# Patient Record
Sex: Female | Born: 1955 | Race: White | Hispanic: No | Smoking: Never smoker
Health system: Southern US, Community
[De-identification: ages and names within clinical notes are randomized; demographics above are authoritative.]

## PROBLEM LIST (undated history)

## (undated) DIAGNOSIS — Z86010 Personal history of colonic polyps: Secondary | ICD-10-CM

## (undated) DIAGNOSIS — Z860101 Personal history of adenomatous and serrated colon polyps: Secondary | ICD-10-CM

## (undated) DIAGNOSIS — F419 Anxiety disorder, unspecified: Secondary | ICD-10-CM

## (undated) DIAGNOSIS — T7840XA Allergy, unspecified, initial encounter: Secondary | ICD-10-CM

## (undated) HISTORY — DX: Allergy, unspecified, initial encounter: T78.40XA

## (undated) HISTORY — PX: COLONOSCOPY: SHX174

## (undated) HISTORY — DX: Anxiety disorder, unspecified: F41.9

## (undated) HISTORY — PX: POLYPECTOMY: SHX149

## (undated) HISTORY — DX: Personal history of adenomatous and serrated colon polyps: Z86.0101

## (undated) HISTORY — DX: Personal history of colonic polyps: Z86.010

---

## 1998-12-08 ENCOUNTER — Ambulatory Visit (HOSPITAL_COMMUNITY): Admission: RE | Admit: 1998-12-08 | Discharge: 1998-12-08 | Payer: Self-pay | Admitting: Obstetrics and Gynecology

## 1998-12-08 ENCOUNTER — Encounter: Payer: Self-pay | Admitting: Obstetrics and Gynecology

## 1999-08-05 ENCOUNTER — Other Ambulatory Visit: Admission: RE | Admit: 1999-08-05 | Discharge: 1999-08-05 | Payer: Self-pay | Admitting: Obstetrics and Gynecology

## 2000-11-16 ENCOUNTER — Other Ambulatory Visit: Admission: RE | Admit: 2000-11-16 | Discharge: 2000-11-16 | Payer: Self-pay | Admitting: Obstetrics and Gynecology

## 2001-11-16 ENCOUNTER — Other Ambulatory Visit: Admission: RE | Admit: 2001-11-16 | Discharge: 2001-11-16 | Payer: Self-pay | Admitting: Obstetrics and Gynecology

## 2002-11-18 ENCOUNTER — Other Ambulatory Visit: Admission: RE | Admit: 2002-11-18 | Discharge: 2002-11-18 | Payer: Self-pay | Admitting: Obstetrics and Gynecology

## 2003-12-22 ENCOUNTER — Other Ambulatory Visit: Admission: RE | Admit: 2003-12-22 | Discharge: 2003-12-22 | Payer: Self-pay | Admitting: Obstetrics and Gynecology

## 2005-01-21 ENCOUNTER — Other Ambulatory Visit: Admission: RE | Admit: 2005-01-21 | Discharge: 2005-01-21 | Payer: Self-pay | Admitting: Obstetrics and Gynecology

## 2006-01-26 ENCOUNTER — Other Ambulatory Visit: Admission: RE | Admit: 2006-01-26 | Discharge: 2006-01-26 | Payer: Self-pay | Admitting: Obstetrics & Gynecology

## 2007-04-05 ENCOUNTER — Other Ambulatory Visit: Admission: RE | Admit: 2007-04-05 | Discharge: 2007-04-05 | Payer: Self-pay | Admitting: Obstetrics & Gynecology

## 2007-06-04 ENCOUNTER — Encounter: Admission: RE | Admit: 2007-06-04 | Discharge: 2007-06-04 | Payer: Self-pay | Admitting: Obstetrics and Gynecology

## 2008-05-05 ENCOUNTER — Ambulatory Visit: Payer: Self-pay | Admitting: Gastroenterology

## 2008-05-12 ENCOUNTER — Telehealth: Payer: Self-pay | Admitting: Gastroenterology

## 2008-05-14 ENCOUNTER — Encounter (INDEPENDENT_AMBULATORY_CARE_PROVIDER_SITE_OTHER): Payer: Self-pay

## 2008-05-14 ENCOUNTER — Ambulatory Visit: Payer: Self-pay | Admitting: Gastroenterology

## 2008-05-14 ENCOUNTER — Encounter: Payer: Self-pay | Admitting: Gastroenterology

## 2008-05-19 ENCOUNTER — Other Ambulatory Visit: Admission: RE | Admit: 2008-05-19 | Discharge: 2008-05-19 | Payer: Self-pay | Admitting: Obstetrics & Gynecology

## 2008-05-20 ENCOUNTER — Encounter: Payer: Self-pay | Admitting: Gastroenterology

## 2008-06-02 DIAGNOSIS — R197 Diarrhea, unspecified: Secondary | ICD-10-CM | POA: Insufficient documentation

## 2008-06-02 DIAGNOSIS — K589 Irritable bowel syndrome without diarrhea: Secondary | ICD-10-CM | POA: Insufficient documentation

## 2008-06-02 DIAGNOSIS — G43909 Migraine, unspecified, not intractable, without status migrainosus: Secondary | ICD-10-CM | POA: Insufficient documentation

## 2008-06-02 DIAGNOSIS — J309 Allergic rhinitis, unspecified: Secondary | ICD-10-CM | POA: Insufficient documentation

## 2008-06-03 ENCOUNTER — Ambulatory Visit: Payer: Self-pay | Admitting: Gastroenterology

## 2008-06-03 LAB — CONVERTED CEMR LAB
ALT: 21 units/L (ref 0–35)
AST: 23 units/L (ref 0–37)
Albumin: 4 g/dL (ref 3.5–5.2)
Alkaline Phosphatase: 65 units/L (ref 39–117)
BUN: 20 mg/dL (ref 6–23)
Basophils Absolute: 0 10*3/uL (ref 0.0–0.1)
Basophils Relative: 0.6 % (ref 0.0–1.0)
Bilirubin, Direct: 0.1 mg/dL (ref 0.0–0.3)
CO2: 30 meq/L (ref 19–32)
Calcium: 9.2 mg/dL (ref 8.4–10.5)
Chloride: 104 meq/L (ref 96–112)
Creatinine, Ser: 0.9 mg/dL (ref 0.4–1.2)
Eosinophils Absolute: 0.2 10*3/uL (ref 0.0–0.7)
Eosinophils Relative: 2.6 % (ref 0.0–5.0)
GFR calc Af Amer: 85 mL/min
GFR calc non Af Amer: 70 mL/min
Glucose, Bld: 105 mg/dL — ABNORMAL HIGH (ref 70–99)
HCT: 37.7 % (ref 36.0–46.0)
Hemoglobin: 13.4 g/dL (ref 12.0–15.0)
Lymphocytes Relative: 29.1 % (ref 12.0–46.0)
MCHC: 35.5 g/dL (ref 30.0–36.0)
MCV: 87.5 fL (ref 78.0–100.0)
Monocytes Absolute: 0.6 10*3/uL (ref 0.1–1.0)
Monocytes Relative: 7.8 % (ref 3.0–12.0)
Neutro Abs: 4.2 10*3/uL (ref 1.4–7.7)
Neutrophils Relative %: 59.9 % (ref 43.0–77.0)
Platelets: 208 10*3/uL (ref 150–400)
Potassium: 3.9 meq/L (ref 3.5–5.1)
RBC: 4.31 M/uL (ref 3.87–5.11)
RDW: 11.8 % (ref 11.5–14.6)
Sodium: 141 meq/L (ref 135–145)
TSH: 0.64 microintl units/mL (ref 0.35–5.50)
Tissue Transglutaminase Ab, IgA: 0.3 units (ref <7)
Total Bilirubin: 0.7 mg/dL (ref 0.3–1.2)
Total Protein: 6.8 g/dL (ref 6.0–8.3)
WBC: 7.1 10*3/uL (ref 4.5–10.5)

## 2008-06-11 ENCOUNTER — Encounter: Admission: RE | Admit: 2008-06-11 | Discharge: 2008-06-11 | Payer: Self-pay | Admitting: Obstetrics and Gynecology

## 2010-12-26 ENCOUNTER — Encounter: Payer: Self-pay | Admitting: Obstetrics and Gynecology

## 2011-01-04 NOTE — Miscellaneous (Signed)
Summary: RX - Meds  Clinical Lists Changes  Medications: Added new medication of LIBRAX 2.5-5 MG  CAPS (CLIDINIUM-CHLORDIAZEPOXIDE) Take 1 tablet three times a day before meals - Signed Rx of LIBRAX 2.5-5 MG  CAPS (CLIDINIUM-CHLORDIAZEPOXIDE) Take 1 tablet three times a day before meals;  #100 x 0;  Signed;  Entered by: Burman Foster RN;  Authorized by: Burman Foster RN;  Method used: Electronic    Prescriptions: LIBRAX 2.5-5 MG  CAPS (CLIDINIUM-CHLORDIAZEPOXIDE) Take 1 tablet three times a day before meals  #100 x 0   Entered and Authorized by:   Burman Foster RN   Signed by:   Burman Foster RN on 05/14/2008   Method used:   Electronically sent to ...       [facility] [address] [phone]*       [address]       [place], [place]  [address]       Ph: [phone] or [phone]       Fax: [phone]   RxID:   [id number]

## 2014-10-10 ENCOUNTER — Telehealth: Payer: Self-pay | Admitting: Gastroenterology

## 2014-10-10 NOTE — Telephone Encounter (Signed)
Please, schedule patient for direct colonoscopy with Dr Ardis Hughs.

## 2014-10-10 NOTE — Telephone Encounter (Signed)
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 Owens Loffler. She is interested in setting up a colonoscopy. She is not having any problems. She reports her mother had colon cancer. Last colon-2009. Please, advise.

## 2014-10-10 NOTE — Telephone Encounter (Signed)
I reviewed previous colonoscopy 2009, her mother had colon cancer (in late 67s I believe).    OK for direct colonoscopy now.

## 2014-10-14 NOTE — Telephone Encounter (Signed)
Left a message for the patient to call and schedule a direct colon with Dr. Ardis Hughs.

## 2014-10-15 ENCOUNTER — Encounter: Payer: Self-pay | Admitting: Gastroenterology

## 2014-11-19 ENCOUNTER — Ambulatory Visit (AMBULATORY_SURGERY_CENTER): Payer: Self-pay | Admitting: *Deleted

## 2014-11-19 VITALS — Ht 67.0 in | Wt 131.6 lb

## 2014-11-19 DIAGNOSIS — Z800 Family history of malignant neoplasm of digestive organs: Secondary | ICD-10-CM

## 2014-11-19 MED ORDER — MOVIPREP 100 G PO SOLR: 1.0000 | Freq: Once | ORAL | Status: DC

## 2014-11-19 NOTE — Progress Notes (Signed)
No issues with past sedation. ewm No egg or soy allergy. ewm No home 02 use. No blood thinners. No diet pills. ewm Pt declined emmi. ewm

## 2014-11-24 ENCOUNTER — Telehealth: Payer: Self-pay | Admitting: Gastroenterology

## 2014-11-24 NOTE — Telephone Encounter (Signed)
Returned phone call to patient. Pt forgot coupon when she went to pharmacy, rx approximately 80.00 without coupon. Wondering if there was anything we could do further. Informed her that I could place a voucher for a free prep at the desk. She stated that she did not want to take that voucher from someone who would really need it. She would just pay for the prep herself and take the coupon with her the next time she went to the pharmacy.

## 2014-12-19 ENCOUNTER — Encounter: Payer: Self-pay | Admitting: Gastroenterology

## 2014-12-19 ENCOUNTER — Ambulatory Visit (AMBULATORY_SURGERY_CENTER): Payer: BLUE CROSS/BLUE SHIELD | Admitting: Gastroenterology

## 2014-12-19 VITALS — BP 128/73 | HR 62 | Temp 98.6°F | Resp 14 | Ht 67.0 in | Wt 131.0 lb

## 2014-12-19 DIAGNOSIS — Z800 Family history of malignant neoplasm of digestive organs: Secondary | ICD-10-CM

## 2014-12-19 DIAGNOSIS — Z1211 Encounter for screening for malignant neoplasm of colon: Secondary | ICD-10-CM

## 2014-12-19 DIAGNOSIS — D122 Benign neoplasm of ascending colon: Secondary | ICD-10-CM

## 2014-12-19 MED ORDER — SODIUM CHLORIDE 0.9 % IV SOLN: 500.0000 mL | INTRAVENOUS | Status: DC

## 2014-12-19 NOTE — Op Note (Signed)
[facility]  [address] [place], [address]   COLONOSCOPY PROCEDURE REPORT  PATIENT: Kelly, Vang  MR#: [id number] BIRTHDATE: [birth year] , 58  yrs. old GENDER: female ENDOSCOPIST: Milus Banister, MD PROCEDURE DATE:  12/19/2014 PROCEDURE:   Colonoscopy with snare polypectomy First Screening Colonoscopy - Avg.  risk and is 50 yrs.  old or older - No.  Prior Negative Screening - Now for repeat screening. Above average risk  History of Adenoma - Now for follow-up colonoscopy & has been > or = to 3 yrs.  N/A  Polyps Removed Today? Yes. ASA CLASS:   Class II INDICATIONS:patient's immediate family history of colon cancer (mother). MEDICATIONS: Monitored anesthesia care and Propofol 200 mg IV  DESCRIPTION OF PROCEDURE:   After the risks benefits and alternatives of the procedure were thoroughly explained, informed consent was obtained.  The digital rectal exam revealed no abnormalities of the rectum.   The LB [id number]  endoscope was introduced through the anus and advanced to the cecum, which was identified by both the appendix and ileocecal valve. No adverse events experienced.   The quality of the prep was excellent.  The instrument was then slowly withdrawn as the colon was fully examined.  COLON FINDINGS: One polyp was found, removed and sent to pathology. This was sessile, located in ascending segment, 106mm across, removed with cold snare.  The examination was otherwise normal. Retroflexed views revealed no abnormalities. The time to cecum=2 minutes 41 seconds.  Withdrawal time=8 minutes 36 seconds.  The scope was withdrawn and the procedure completed. COMPLICATIONS: There were no immediate complications.  ENDOSCOPIC IMPRESSION: One polyp was found, removed and sent to pathology. The examination was otherwise normal  RECOMMENDATIONS: If the polyp(s) removed today are proven to be adenomatous (pre-cancerous) polyps, you will need a repeat  colonoscopy in 5 years.   You will receive a letter within 1-2 weeks with the results of your biopsy as well as final recommendations.  Please call my office if you have not received a letter after 3 weeks.  eSigned:  Milus Banister, MD 12/19/2014 8:42 AM

## 2014-12-19 NOTE — Patient Instructions (Signed)
YOU HAD AN ENDOSCOPIC PROCEDURE TODAY AT THE [facility]: Refer to the procedure report that was given to you for any specific questions about what was found during the examination.  If the procedure report does not answer your questions, please call your gastroenterologist to clarify.  If you requested that your care partner not be given the details of your procedure findings, then the procedure report has been included in a sealed envelope for you to review at your convenience later.  YOU SHOULD EXPECT: Some feelings of bloating in the abdomen. Passage of more gas than usual.  Walking can help get rid of the air that was put into your GI tract during the procedure and reduce the bloating. If you had a lower endoscopy (such as a colonoscopy or flexible sigmoidoscopy) you may notice spotting of blood in your stool or on the toilet paper. If you underwent a bowel prep for your procedure, then you may not have a normal bowel movement for a few days.  DIET: Your first meal following the procedure should be a light meal and then it is ok to progress to your normal diet.  A half-sandwich or bowl of soup is an example of a good first meal.  Heavy or fried foods are harder to digest and may make you feel nauseous or bloated.  Likewise meals heavy in dairy and vegetables can cause extra gas to form and this can also increase the bloating.  Drink plenty of fluids but you should avoid alcoholic beverages for 24 hours.  ACTIVITY: Your care partner should take you home directly after the procedure.  You should plan to take it easy, moving slowly for the rest of the day.  You can resume normal activity the day after the procedure however you should NOT DRIVE or use heavy machinery for 24 hours (because of the sedation medicines used during the test).    SYMPTOMS TO REPORT IMMEDIATELY: A gastroenterologist can be reached at any hour.  During normal business hours, 8:30 AM to 5:00 PM Monday through Friday,  call [phone].  After hours and on weekends, please call the GI answering service at [phone] who will take a message and have the physician on call contact you.   Following lower endoscopy (colonoscopy or flexible sigmoidoscopy):  Excessive amounts of blood in the stool  Significant tenderness or worsening of abdominal pains  Swelling of the abdomen that is new, acute  Fever of 100F or higher   FOLLOW UP: If any biopsies were taken you will be contacted by phone or by letter within the next 1-3 weeks.  Call your gastroenterologist if you have not heard about the biopsies in 3 weeks.  Our staff will call the home number listed on your records the next business day following your procedure to check on you and address any questions or concerns that you may have at that time regarding the information given to you following your procedure. This is a courtesy call and so if there is no answer at the home number and we have not heard from you through the emergency physician on call, we will assume that you have returned to your regular daily activities without incident.  SIGNATURES/CONFIDENTIALITY: You and/or your care partner have signed paperwork which will be entered into your electronic medical record.  These signatures attest to the fact that that the information above on your After Visit Summary has been reviewed and is understood.  Full responsibility of the confidentiality of   this discharge information lies with you and/or your care-partner.  Polyp-handout given  Repeat colonoscopy will be determined by pathology   

## 2014-12-19 NOTE — Progress Notes (Signed)
Report to PACU, RN, vss, BBS= Clear.  

## 2014-12-19 NOTE — Progress Notes (Signed)
Called to room to assist during endoscopic procedure.  Patient ID and intended procedure confirmed with present staff. Received instructions for my participation in the procedure from the performing physician.  

## 2014-12-22 ENCOUNTER — Telehealth: Payer: Self-pay | Admitting: *Deleted

## 2014-12-22 NOTE — Telephone Encounter (Signed)
  Follow up Call-  Call back number 12/19/2014  Post procedure Call Back phone  # [phone]  Permission to leave phone message Yes     Patient questions:  Do you have a fever, pain , or abdominal swelling? No. Pain Score  0 *  Have you tolerated food without any problems? Yes.    Have you been able to return to your normal activities? Yes.    Do you have any questions about your discharge instructions: Diet   No. Medications  No. Follow up visit  No.  Do you have questions or concerns about your Care? No.  Actions: * If pain score is 4 or above: No action needed, pain <4.

## 2014-12-28 ENCOUNTER — Encounter: Payer: Self-pay | Admitting: Gastroenterology

## 2018-07-30 DIAGNOSIS — R682 Dry mouth, unspecified: Secondary | ICD-10-CM | POA: Diagnosis not present

## 2018-07-30 DIAGNOSIS — H6121 Impacted cerumen, right ear: Secondary | ICD-10-CM | POA: Diagnosis not present

## 2018-07-30 DIAGNOSIS — J343 Hypertrophy of nasal turbinates: Secondary | ICD-10-CM | POA: Diagnosis not present

## 2018-07-30 DIAGNOSIS — J310 Chronic rhinitis: Secondary | ICD-10-CM | POA: Diagnosis not present

## 2018-07-30 DIAGNOSIS — H6983 Other specified disorders of Eustachian tube, bilateral: Secondary | ICD-10-CM | POA: Diagnosis not present

## 2018-10-23 ENCOUNTER — Other Ambulatory Visit (HOSPITAL_COMMUNITY)
Admission: RE | Admit: 2018-10-23 | Discharge: 2018-10-23 | Disposition: A | Discharge status: Home / Self Care | Payer: BLUE CROSS/BLUE SHIELD | Source: Ambulatory Visit | Attending: Family Medicine | Admitting: Family Medicine

## 2018-10-23 ENCOUNTER — Other Ambulatory Visit: Payer: Self-pay | Admitting: Family Medicine

## 2018-10-23 DIAGNOSIS — Z0000 Encounter for general adult medical examination without abnormal findings: Secondary | ICD-10-CM | POA: Insufficient documentation

## 2018-10-23 DIAGNOSIS — Z1231 Encounter for screening mammogram for malignant neoplasm of breast: Secondary | ICD-10-CM

## 2018-10-23 DIAGNOSIS — Z23 Encounter for immunization: Secondary | ICD-10-CM | POA: Diagnosis not present

## 2018-10-23 DIAGNOSIS — R030 Elevated blood-pressure reading, without diagnosis of hypertension: Secondary | ICD-10-CM | POA: Diagnosis not present

## 2018-10-23 DIAGNOSIS — Z13220 Encounter for screening for lipoid disorders: Secondary | ICD-10-CM | POA: Diagnosis not present

## 2018-10-24 LAB — CYTOLOGY - PAP
Diagnosis: NEGATIVE
HPV (WINDOPATH): NOT DETECTED

## 2018-11-13 DIAGNOSIS — R030 Elevated blood-pressure reading, without diagnosis of hypertension: Secondary | ICD-10-CM | POA: Diagnosis not present

## 2018-11-13 DIAGNOSIS — Z23 Encounter for immunization: Secondary | ICD-10-CM | POA: Diagnosis not present

## 2019-10-14 ENCOUNTER — Other Ambulatory Visit: Payer: Self-pay

## 2019-10-14 DIAGNOSIS — Z20822 Contact with and (suspected) exposure to covid-19: Secondary | ICD-10-CM

## 2019-10-16 LAB — NOVEL CORONAVIRUS, NAA: SARS-CoV-2, NAA: NOT DETECTED

## 2019-10-25 DIAGNOSIS — Z13220 Encounter for screening for lipoid disorders: Secondary | ICD-10-CM | POA: Diagnosis not present

## 2019-10-25 DIAGNOSIS — Z0000 Encounter for general adult medical examination without abnormal findings: Secondary | ICD-10-CM | POA: Diagnosis not present

## 2019-10-29 ENCOUNTER — Other Ambulatory Visit: Payer: Self-pay | Admitting: Family Medicine

## 2019-10-29 DIAGNOSIS — Z1231 Encounter for screening mammogram for malignant neoplasm of breast: Secondary | ICD-10-CM

## 2019-10-30 ENCOUNTER — Encounter: Payer: Self-pay | Admitting: Gastroenterology

## 2019-11-21 DIAGNOSIS — Z13220 Encounter for screening for lipoid disorders: Secondary | ICD-10-CM | POA: Diagnosis not present

## 2019-11-21 DIAGNOSIS — Z1231 Encounter for screening mammogram for malignant neoplasm of breast: Secondary | ICD-10-CM | POA: Diagnosis not present

## 2019-11-21 DIAGNOSIS — Z0000 Encounter for general adult medical examination without abnormal findings: Secondary | ICD-10-CM | POA: Diagnosis not present

## 2019-11-21 DIAGNOSIS — Z23 Encounter for immunization: Secondary | ICD-10-CM | POA: Diagnosis not present

## 2019-12-16 ENCOUNTER — Other Ambulatory Visit: Payer: Self-pay

## 2019-12-16 ENCOUNTER — Ambulatory Visit (AMBULATORY_SURGERY_CENTER): Payer: Self-pay | Admitting: *Deleted

## 2019-12-16 VITALS — Temp 96.6°F | Ht 67.0 in | Wt 128.0 lb

## 2019-12-16 DIAGNOSIS — Z86010 Personal history of colonic polyps: Secondary | ICD-10-CM

## 2019-12-16 DIAGNOSIS — Z800 Family history of malignant neoplasm of digestive organs: Secondary | ICD-10-CM

## 2019-12-16 DIAGNOSIS — Z01818 Encounter for other preprocedural examination: Secondary | ICD-10-CM

## 2019-12-16 MED ORDER — SUPREP BOWEL PREP KIT 17.5-3.13-1.6 GM/177ML PO SOLN: 1.0000 | Freq: Once | ORAL | 0 refills | Status: AC

## 2019-12-16 NOTE — Progress Notes (Signed)
No egg or soy allergy known to patient  No issues with past sedation with any surgeries  or procedures, no intubation problems  No diet pills per patient No home 02 use per patient  No blood thinners per patient  Pt denies issues with constipation -PT HAS INCREASED GAS ISSUES  No A fib or A flutter  EMMI video sent to pt's e mail  SUPREP $15 COUPON   Due to the COVID-19 pandemic we are asking patients to follow these guidelines. Please only bring one care partner. Please be aware that your care partner may wait in the car in the parking lot or if they feel like they will be too hot to wait in the car, they may wait in the lobby on the 4th floor. All care partners are required to wear a mask the entire time (we do not have any that we can provide them), they need to practice social distancing, and we will do a Covid check for all patient's and care partners when you arrive. Also we will check their temperature and your temperature. If the care partner waits in their car they need to stay in the parking lot the entire time and we will call them on their cell phone when the patient is ready for discharge so they can bring the car to the front of the building. Also all patient's will need to wear a mask into building.

## 2019-12-18 ENCOUNTER — Encounter: Payer: Self-pay | Admitting: Gastroenterology

## 2019-12-26 ENCOUNTER — Ambulatory Visit (INDEPENDENT_AMBULATORY_CARE_PROVIDER_SITE_OTHER): Payer: BC Managed Care – PPO

## 2019-12-26 ENCOUNTER — Other Ambulatory Visit: Payer: Self-pay | Admitting: Gastroenterology

## 2019-12-26 ENCOUNTER — Ambulatory Visit
Admission: RE | Admit: 2019-12-26 | Discharge: 2019-12-26 | Disposition: A | Discharge status: Home / Self Care | Payer: BC Managed Care – PPO | Source: Ambulatory Visit | Attending: Family Medicine | Admitting: Family Medicine

## 2019-12-26 ENCOUNTER — Other Ambulatory Visit: Payer: Self-pay

## 2019-12-26 DIAGNOSIS — Z1159 Encounter for screening for other viral diseases: Secondary | ICD-10-CM

## 2019-12-26 DIAGNOSIS — Z1231 Encounter for screening mammogram for malignant neoplasm of breast: Secondary | ICD-10-CM | POA: Diagnosis not present

## 2019-12-27 LAB — SARS CORONAVIRUS 2 (TAT 6-24 HRS): SARS Coronavirus 2: NEGATIVE

## 2019-12-30 ENCOUNTER — Other Ambulatory Visit: Payer: Self-pay

## 2019-12-30 ENCOUNTER — Ambulatory Visit (AMBULATORY_SURGERY_CENTER): Payer: BC Managed Care – PPO | Admitting: Gastroenterology

## 2019-12-30 ENCOUNTER — Encounter: Payer: Self-pay | Admitting: Gastroenterology

## 2019-12-30 VITALS — BP 118/67 | HR 68 | Temp 97.1°F | Resp 12 | Ht 67.0 in | Wt 128.0 lb

## 2019-12-30 DIAGNOSIS — Z86010 Personal history of colonic polyps: Secondary | ICD-10-CM

## 2019-12-30 DIAGNOSIS — Z1211 Encounter for screening for malignant neoplasm of colon: Secondary | ICD-10-CM | POA: Diagnosis not present

## 2019-12-30 MED ORDER — SODIUM CHLORIDE 0.9 % IV SOLN: 500.0000 mL | Freq: Once | INTRAVENOUS | Status: DC

## 2019-12-30 NOTE — Patient Instructions (Signed)
HANDOUTS PROVIDED ON: DIVERTICULOSIS  You may resume your previous diet and medication schedule.  Your next colonoscopy for screening purposes should occur in 5 years.  Thank you for allowing [place] to care for you today!!!  YOU HAD AN ENDOSCOPIC PROCEDURE TODAY AT [facility]:   Refer to the procedure report that was given to you for any specific questions about what was found during the examination.  If the procedure report does not answer your questions, please call your gastroenterologist to clarify.  If you requested that your care partner not be given the details of your procedure findings, then the procedure report has been included in a sealed envelope for you to review at your convenience later.  YOU SHOULD EXPECT: Some feelings of bloating in the abdomen. Passage of more gas than usual.  Walking can help get rid of the air that was put into your GI tract during the procedure and reduce the bloating. If you had a lower endoscopy (such as a colonoscopy or flexible sigmoidoscopy) you may notice spotting of blood in your stool or on the toilet paper. If you underwent a bowel prep for your procedure, you may not have a normal bowel movement for a few days.  Please Note:  You might notice some irritation and congestion in your nose or some drainage.  This is from the oxygen used during your procedure.  There is no need for concern and it should clear up in a day or so.  SYMPTOMS TO REPORT IMMEDIATELY:   Following lower endoscopy (colonoscopy or flexible sigmoidoscopy):  Excessive amounts of blood in the stool  Significant tenderness or worsening of abdominal pains  Swelling of the abdomen that is new, acute  Fever of 100F or higher  For urgent or emergent issues, a gastroenterologist can be reached at any hour by calling [phone].   DIET:  We do recommend a small meal at first, but then you may proceed to your regular diet.  Drink plenty of fluids but you should  avoid alcoholic beverages for 24 hours.  ACTIVITY:  You should plan to take it easy for the rest of today and you should NOT DRIVE or use heavy machinery until tomorrow (because of the sedation medicines used during the test).    FOLLOW UP: Our staff will call the number listed on your records 48-72 hours following your procedure to check on you and address any questions or concerns that you may have regarding the information given to you following your procedure. If we do not reach you, we will leave a message.  We will attempt to reach you two times.  During this call, we will ask if you have developed any symptoms of COVID 19. If you develop any symptoms (ie: fever, flu-like symptoms, shortness of breath, cough etc.) before then, please call [phone].  If you test positive for Covid 19 in the 2 weeks post procedure, please call and report this information to [place].    If any biopsies were taken you will be contacted by phone or by letter within the next 1-3 weeks.  Please call us at [phone] if you have not heard about the biopsies in 3 weeks.    SIGNATURES/CONFIDENTIALITY: You and/or your care partner have signed paperwork which will be entered into your electronic medical record.  These signatures attest to the fact that that the information above on your After Visit Summary has been reviewed and is understood.  Full responsibility of the confidentiality  of this discharge information lies with you and/or your care-partner.

## 2019-12-30 NOTE — Progress Notes (Signed)
Temp by LC and vitals by DT

## 2019-12-30 NOTE — Op Note (Signed)
[facility] Patient Name: Kelly Vang Procedure Date: 12/30/2019 8:56 AM MRN: [id number] Endoscopist: Milus Banister , MD Age: 64 Referring MD:  Date of Birth: 08-31-1956 Gender: Female Account #: [id number] Procedure:                Colonoscopy Indications:              High risk colon cancer surveillance: Personal                            history of colonic polyps; Colonoscopy 2016 single                            subCM adenoma, also mother and sister with colon                            cancer Medicines:                Monitored Anesthesia Care Procedure:                Pre-Anesthesia Assessment:                           - Prior to the procedure, a History and Physical                            was performed, and patient medications and                            allergies were reviewed. The patient's tolerance of                            previous anesthesia was also reviewed. The risks                            and benefits of the procedure and the sedation                            options and risks were discussed with the patient.                            All questions were answered, and informed consent                            was obtained. Prior Anticoagulants: The patient has                            taken no previous anticoagulant or antiplatelet                            agents. ASA Grade Assessment: II - A patient with                            mild systemic disease. After reviewing the risks  and benefits, the patient was deemed in                            satisfactory condition to undergo the procedure.                           After obtaining informed consent, the colonoscope                            was passed under direct vision. Throughout the                            procedure, the patient's blood pressure, pulse, and                            oxygen saturations were monitored continuously. The                        Colonoscope was introduced through the anus and                            advanced to the the cecum, identified by                            appendiceal orifice and ileocecal valve. The                            colonoscopy was performed without difficulty. The                            patient tolerated the procedure well. The quality                            of the bowel preparation was good. The ileocecal                            valve, appendiceal orifice, and rectum were                            photographed. Scope In: 9:02:15 AM Scope Out: 9:17:01 AM Scope Withdrawal Time: 0 hours 11 minutes 51 seconds  Total Procedure Duration: 0 hours 14 minutes 46 seconds  Findings:                 A few small-mouthed diverticula were found in the                            left colon.                           The exam was otherwise without abnormality on                            direct and retroflexion views. Complications:            No immediate complications. Estimated blood loss:  None. Estimated Blood Loss:     Estimated blood loss: none. Impression:               - Diverticulosis in the left colon.                           - The examination was otherwise normal on direct                            and retroflexion views.                           - No polyps or cancers. Recommendation:           - Patient has a contact number available for                            emergencies. The signs and symptoms of potential                            delayed complications were discussed with the                            patient. Return to normal activities tomorrow.                            Written discharge instructions were provided to the                            patient.                           - Resume previous diet.                           - Continue present medications.                           - Repeat colonoscopy in  5 years for screening given                            significant FH of colon cancer (sister and mother). Milus Banister, MD 12/30/2019 9:20:44 AM This report has been signed electronically.

## 2019-12-30 NOTE — Progress Notes (Signed)
PT taken to PACU. Monitors in place. VSS. Report given to RN. 

## 2020-01-01 ENCOUNTER — Telehealth: Payer: Self-pay | Admitting: *Deleted

## 2020-01-01 NOTE — Telephone Encounter (Signed)
  Follow up Call-  Call back number 12/30/2019  Post procedure Call Back phone  # [phone]  Permission to leave phone message Yes  Some recent data might be hidden     Patient questions:  Do you have a fever, pain , or abdominal swelling? No. Pain Score  0 *  Have you tolerated food without any problems? Yes.    Have you been able to return to your normal activities? Yes.    Do you have any questions about your discharge instructions: Diet   No. Medications  No. Follow up visit  No.  Do you have questions or concerns about your Care? No.  Actions: * If pain score is 4 or above: No action needed, pain <4.  1. Have you developed a fever since your procedure? no  2.   Have you had an respiratory symptoms (SOB or cough) since your procedure? no  3.   Have you tested positive for COVID 19 since your procedure no  4.   Have you had any family members/close contacts diagnosed with the COVID 19 since your procedure?  no   If yes to any of these questions please route to Joylene John, RN and Alphonsa Gin, Therapist, sports.

## 2020-01-16 ENCOUNTER — Ambulatory Visit: Payer: BC Managed Care – PPO

## 2020-01-20 DIAGNOSIS — Z20828 Contact with and (suspected) exposure to other viral communicable diseases: Secondary | ICD-10-CM | POA: Diagnosis not present

## 2020-01-30 ENCOUNTER — Ambulatory Visit: Payer: BC Managed Care – PPO | Attending: Internal Medicine

## 2020-01-30 DIAGNOSIS — Z23 Encounter for immunization: Secondary | ICD-10-CM | POA: Insufficient documentation

## 2020-01-30 NOTE — Progress Notes (Signed)
   Covid-19 Vaccination Clinic  Name:  Kelly Vang    MRN: [id number] DOB: June 27, 1956  01/30/2020  Ms. Kelly Vang was observed post Covid-19 immunization for 15 minutes without incidence. She was provided with Vaccine Information Sheet and instruction to access the V-Safe system.   Ms. Kelly Vang was instructed to call 911 with any severe reactions post vaccine: Marland Kitchen Difficulty breathing  . Swelling of your face and throat  . A fast heartbeat  . A bad rash all over your body  . Dizziness and weakness    Immunizations Administered    Name Date Dose VIS Date Route   Pfizer COVID-19 Vaccine 01/30/2020  2:05 PM 0.3 mL 11/15/2019 Intramuscular   Manufacturer: [place]   Lot: [id number]   [place]: [id number]

## 2020-02-26 ENCOUNTER — Ambulatory Visit: Payer: BC Managed Care – PPO | Attending: Internal Medicine

## 2020-02-26 DIAGNOSIS — Z23 Encounter for immunization: Secondary | ICD-10-CM

## 2020-02-26 NOTE — Progress Notes (Signed)
   Covid-19 Vaccination Clinic  Name:  Kelly Vang    MRN: [id number] DOB: [birth year]  02/26/2020  [name] was observed post Covid-19 immunization for 15 minutes without incident. She was provided with Vaccine Information Sheet and instruction to access the V-Safe system.   [name] was instructed to call 911 with any severe reactions post vaccine: Marland Kitchen Difficulty breathing  . Swelling of face and throat  . A fast heartbeat  . A bad rash all over body  . Dizziness and weakness   Immunizations Administered    Name Date Dose VIS Date Route   Pfizer COVID-19 Vaccine 02/26/2020  1:57 PM 0.3 mL 11/15/2019 Intramuscular   Manufacturer: [place]   Lot: [id number]   [place]: [id number]

## 2020-04-30 DIAGNOSIS — Z23 Encounter for immunization: Secondary | ICD-10-CM | POA: Diagnosis not present

## 2021-01-28 ENCOUNTER — Other Ambulatory Visit: Payer: Self-pay | Admitting: Family Medicine

## 2021-01-28 DIAGNOSIS — Z1231 Encounter for screening mammogram for malignant neoplasm of breast: Secondary | ICD-10-CM

## 2021-01-28 DIAGNOSIS — Z780 Asymptomatic menopausal state: Secondary | ICD-10-CM

## 2021-05-26 IMAGING — MG DIGITAL SCREENING BILAT W/ TOMO W/ CAD
6 of 10 series · 6 of 30 positions shown · non-contrast
Comparison: Previous exam(s).

CLINICAL DATA: Screening.

EXAM:
DIGITAL SCREENING BILATERAL MAMMOGRAM WITH TOMO AND CAD

[R MLO synth-2D (1 of 2)]
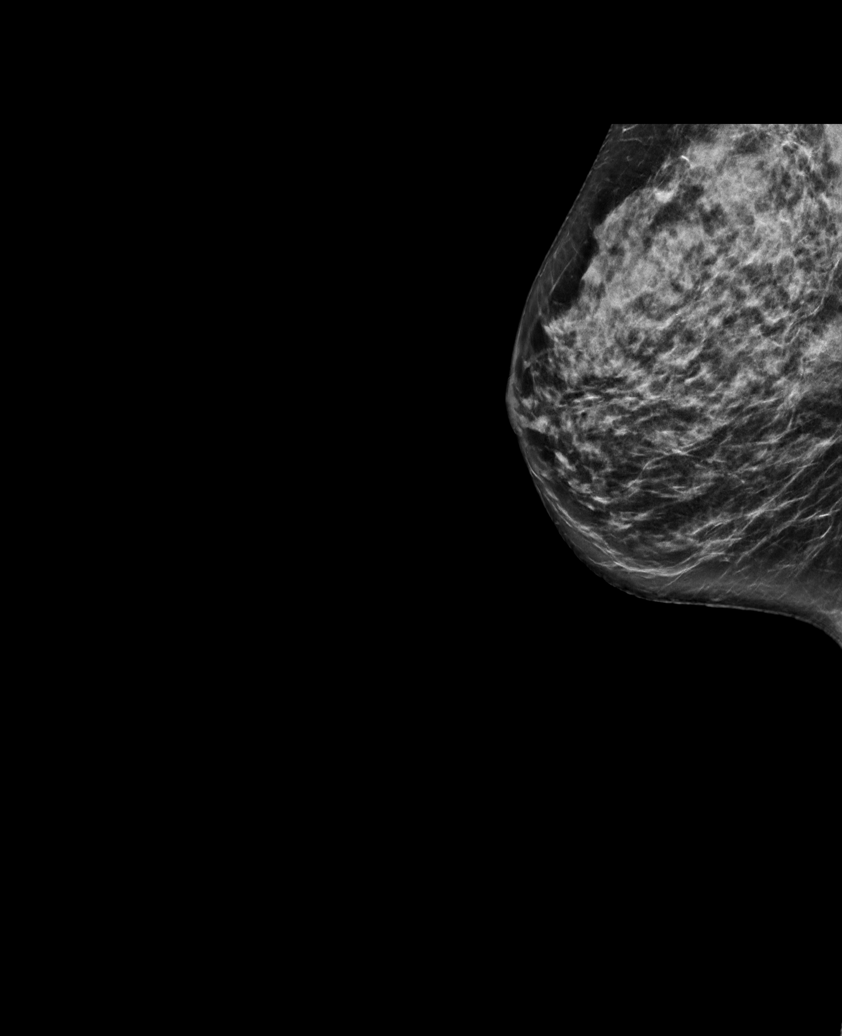

[L CC synth-2D]
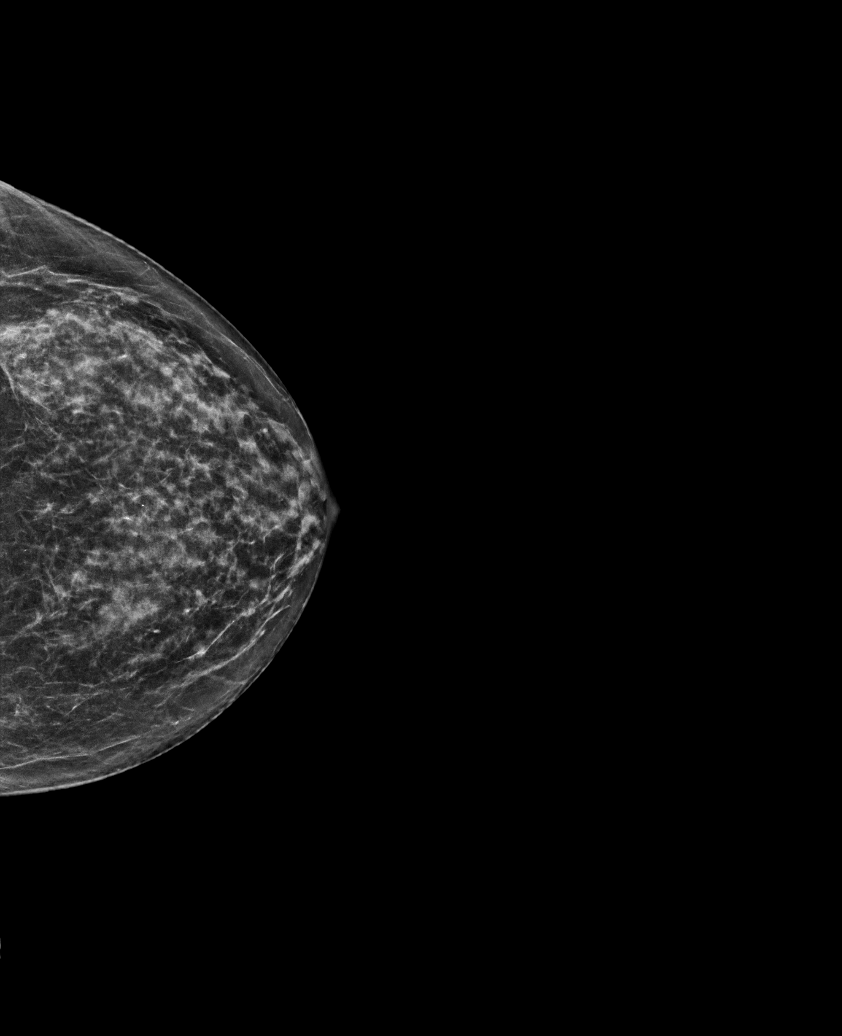

[L MLO synth-2D]
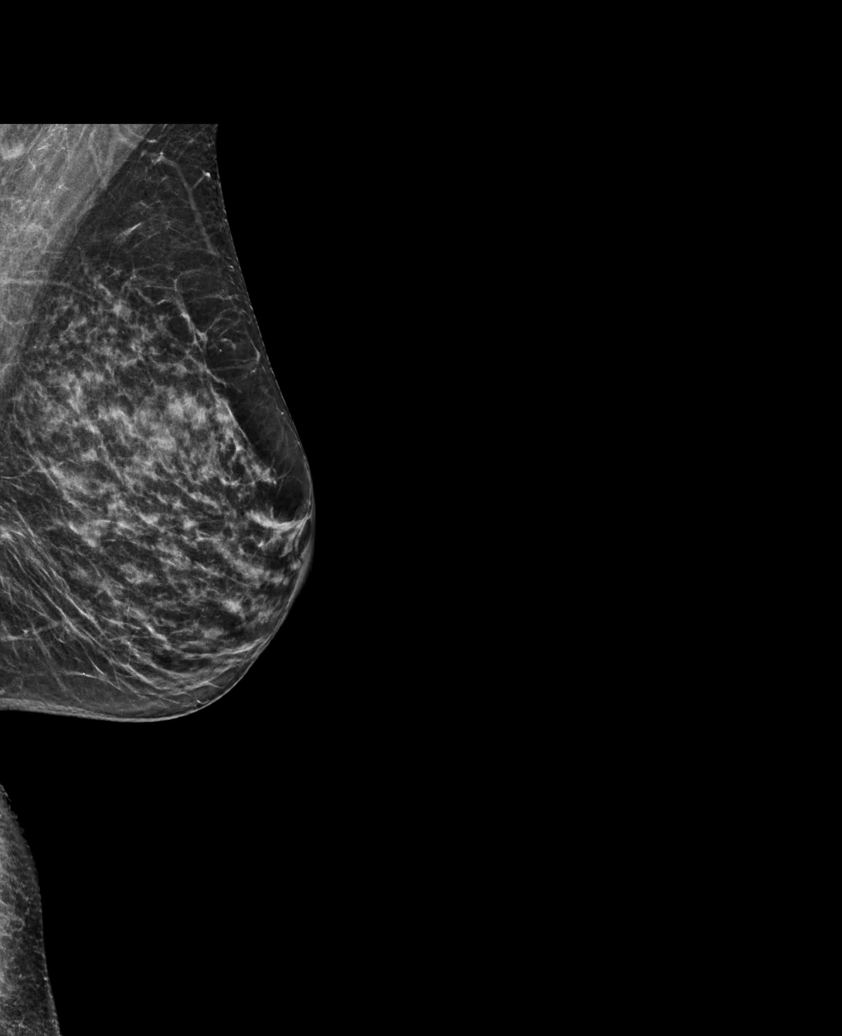

[R MLO synth-2D (2 of 2)]
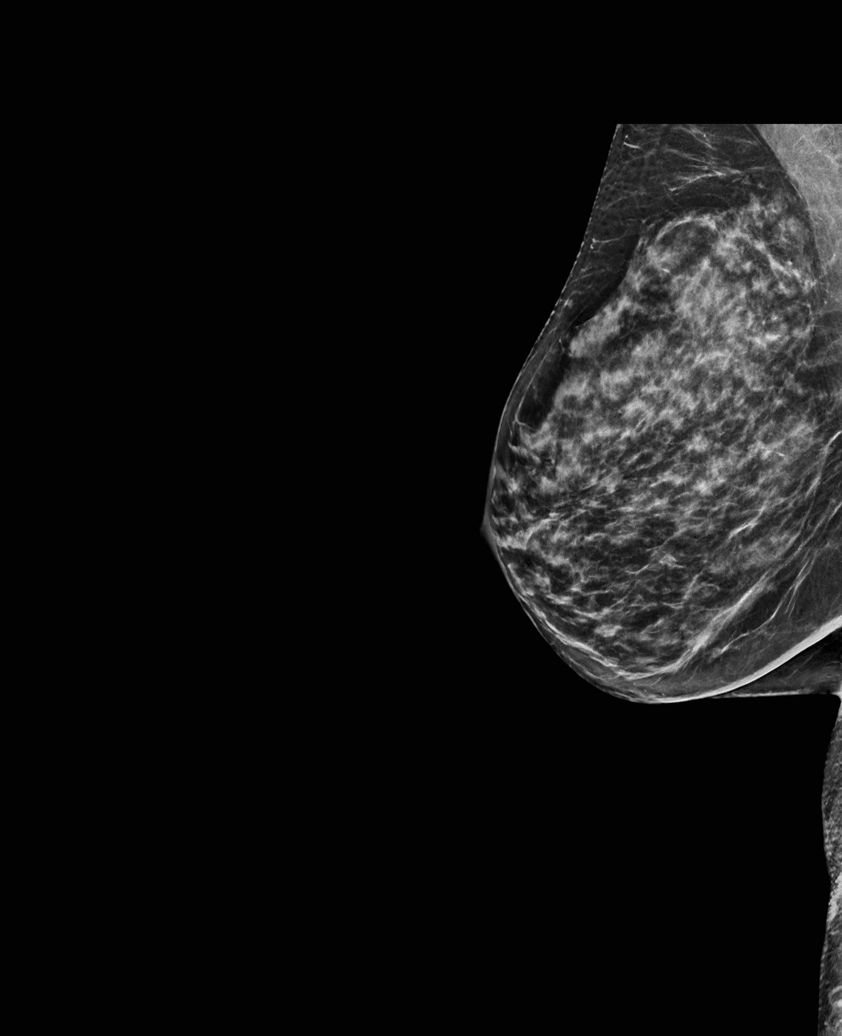

[R CC synth-2D]
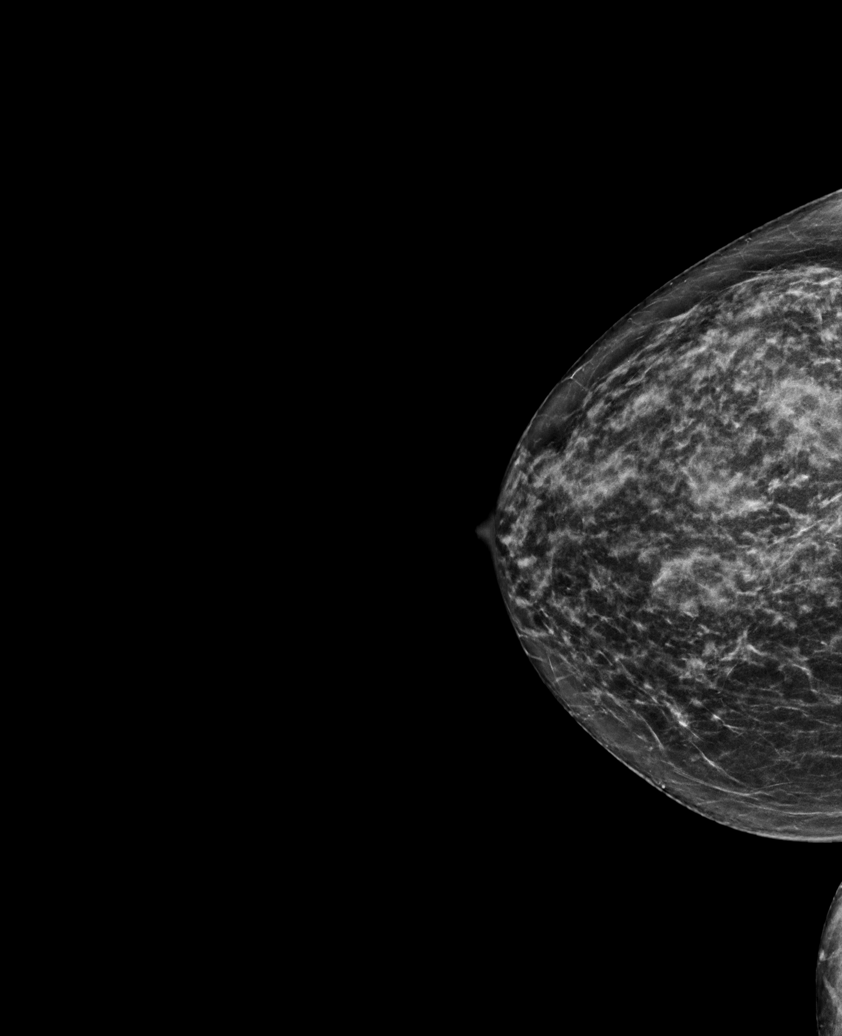

[R MLO tomo · tomo slice 33/64.0]
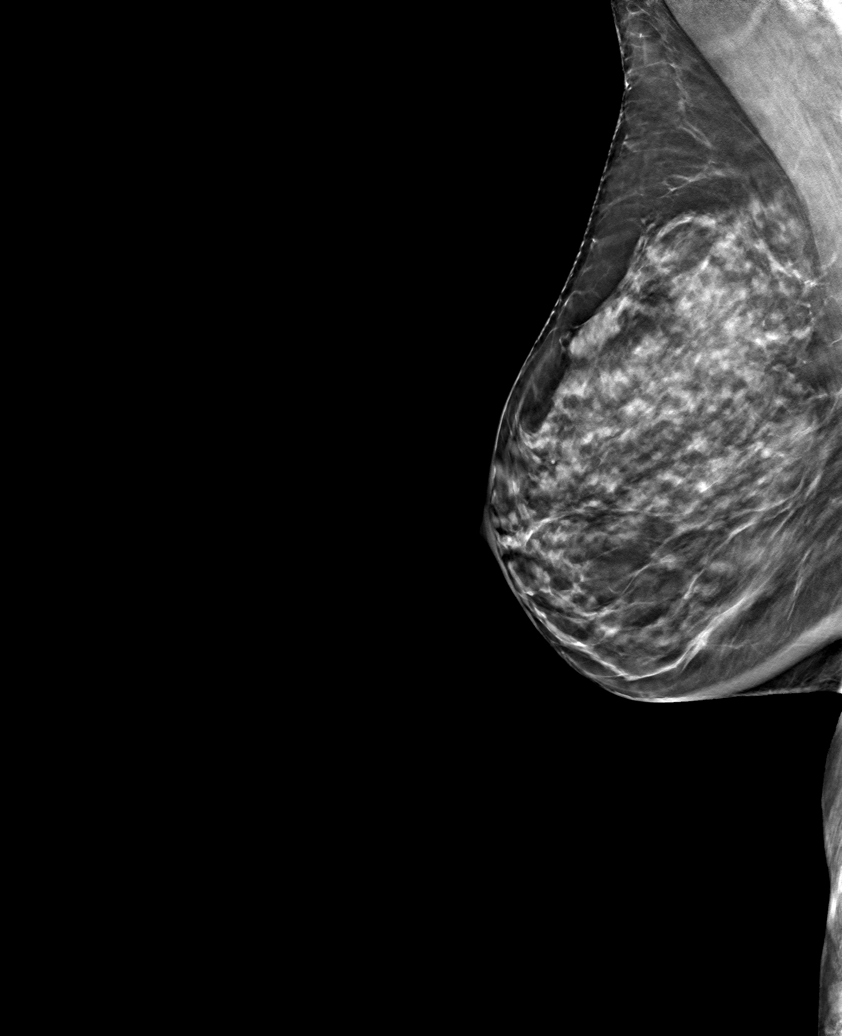

[6 of 30 positions shown; findings below may reference images not displayed]

ACR Breast Density Category c: The breast tissue is heterogeneously
dense, which may obscure small masses.
FINDINGS: There are no findings suspicious for malignancy. Images were
processed with CAD.
IMPRESSION: No mammographic evidence of malignancy. A result letter of this
screening mammogram will be mailed directly to the patient.

RECOMMENDATION:
Screening mammogram in one year. (Code:[id number]-[id number])

BI-RADS CATEGORY  1: Negative.

## 2021-07-08 ENCOUNTER — Other Ambulatory Visit: Payer: Self-pay

## 2021-07-08 ENCOUNTER — Ambulatory Visit
Admission: RE | Admit: 2021-07-08 | Discharge: 2021-07-08 | Disposition: A | Discharge status: Home / Self Care | Payer: Medicare Other | Source: Ambulatory Visit | Attending: Family Medicine | Admitting: Family Medicine

## 2021-07-08 DIAGNOSIS — Z1231 Encounter for screening mammogram for malignant neoplasm of breast: Secondary | ICD-10-CM

## 2021-07-08 DIAGNOSIS — Z780 Asymptomatic menopausal state: Secondary | ICD-10-CM

## 2023-05-09 ENCOUNTER — Other Ambulatory Visit: Payer: Self-pay | Admitting: Family Medicine

## 2023-05-09 DIAGNOSIS — Z1231 Encounter for screening mammogram for malignant neoplasm of breast: Secondary | ICD-10-CM

## 2023-05-10 ENCOUNTER — Ambulatory Visit
Admission: RE | Admit: 2023-05-10 | Discharge: 2023-05-10 | Disposition: A | Discharge status: Home / Self Care | Payer: Medicare Other | Source: Ambulatory Visit | Attending: Family Medicine | Admitting: Family Medicine

## 2023-05-10 DIAGNOSIS — Z1231 Encounter for screening mammogram for malignant neoplasm of breast: Secondary | ICD-10-CM

## 2023-06-16 ENCOUNTER — Other Ambulatory Visit: Payer: Self-pay | Admitting: Family Medicine

## 2023-06-16 DIAGNOSIS — M8588 Other specified disorders of bone density and structure, other site: Secondary | ICD-10-CM

## 2024-04-09 ENCOUNTER — Ambulatory Visit
Admission: RE | Admit: 2024-04-09 | Discharge: 2024-04-09 | Disposition: A | Discharge status: Home / Self Care | Payer: Medicare Other | Source: Ambulatory Visit | Attending: Family Medicine | Admitting: Family Medicine

## 2024-04-09 DIAGNOSIS — M8588 Other specified disorders of bone density and structure, other site: Secondary | ICD-10-CM

## 2024-08-14 ENCOUNTER — Other Ambulatory Visit: Payer: Self-pay | Admitting: Family Medicine

## 2024-08-14 DIAGNOSIS — Z1231 Encounter for screening mammogram for malignant neoplasm of breast: Secondary | ICD-10-CM

## 2024-08-30 ENCOUNTER — Ambulatory Visit
Admission: RE | Admit: 2024-08-30 | Discharge: 2024-08-30 | Disposition: A | Discharge status: Home / Self Care | Source: Ambulatory Visit | Attending: Family Medicine | Admitting: Family Medicine

## 2024-08-30 DIAGNOSIS — Z1231 Encounter for screening mammogram for malignant neoplasm of breast: Secondary | ICD-10-CM
